# Patient Record
Sex: Female | Born: 1960 | Race: White | Hispanic: No | Smoking: Former smoker
Health system: Southern US, Community
[De-identification: ages and names within clinical notes are randomized; demographics above are authoritative.]

## PROBLEM LIST (undated history)

## (undated) DIAGNOSIS — M1990 Unspecified osteoarthritis, unspecified site: Secondary | ICD-10-CM

## (undated) DIAGNOSIS — I509 Heart failure, unspecified: Secondary | ICD-10-CM

## (undated) DIAGNOSIS — C73 Malignant neoplasm of thyroid gland: Secondary | ICD-10-CM

## (undated) DIAGNOSIS — J45909 Unspecified asthma, uncomplicated: Secondary | ICD-10-CM

## (undated) DIAGNOSIS — I10 Essential (primary) hypertension: Secondary | ICD-10-CM

## (undated) DIAGNOSIS — I428 Other cardiomyopathies: Secondary | ICD-10-CM

## (undated) DIAGNOSIS — Z87442 Personal history of urinary calculi: Secondary | ICD-10-CM

## (undated) DIAGNOSIS — R112 Nausea with vomiting, unspecified: Secondary | ICD-10-CM

## (undated) DIAGNOSIS — Z9889 Other specified postprocedural states: Secondary | ICD-10-CM

## (undated) DIAGNOSIS — F419 Anxiety disorder, unspecified: Secondary | ICD-10-CM

## (undated) DIAGNOSIS — E039 Hypothyroidism, unspecified: Secondary | ICD-10-CM

## (undated) HISTORY — DX: Essential (primary) hypertension: I10

## (undated) HISTORY — PX: REPLACEMENT TOTAL KNEE: SUR1224

## (undated) HISTORY — DX: Other cardiomyopathies: I42.8

## (undated) HISTORY — PX: CHOLECYSTECTOMY: SHX55

## (undated) HISTORY — DX: Other specified postprocedural states: Z98.890

## (undated) HISTORY — DX: Malignant neoplasm of thyroid gland: C73

## (undated) HISTORY — PX: CARPAL TUNNEL RELEASE: SHX101

---

## 1997-05-28 HISTORY — PX: THYROIDECTOMY: SHX17

## 1998-01-13 ENCOUNTER — Other Ambulatory Visit: Admission: RE | Admit: 1998-01-13 | Discharge: 1998-01-13 | Payer: Self-pay | Admitting: *Deleted

## 1998-03-31 ENCOUNTER — Encounter: Payer: Self-pay | Admitting: Surgery

## 1998-04-01 ENCOUNTER — Ambulatory Visit (HOSPITAL_COMMUNITY): Admission: RE | Admit: 1998-04-01 | Discharge: 1998-04-02 | Payer: Self-pay | Admitting: Surgery

## 2006-07-15 ENCOUNTER — Ambulatory Visit (HOSPITAL_COMMUNITY): Admission: RE | Admit: 2006-07-15 | Discharge: 2006-07-15 | Payer: Self-pay | Admitting: Family Medicine

## 2006-08-21 ENCOUNTER — Ambulatory Visit (HOSPITAL_COMMUNITY): Admission: RE | Admit: 2006-08-21 | Discharge: 2006-08-21 | Payer: Self-pay | Admitting: Family Medicine

## 2007-10-02 ENCOUNTER — Ambulatory Visit (HOSPITAL_COMMUNITY): Admission: RE | Admit: 2007-10-02 | Discharge: 2007-10-02 | Payer: Self-pay | Admitting: Family Medicine

## 2013-04-09 ENCOUNTER — Ambulatory Visit
Admission: RE | Admit: 2013-04-09 | Discharge: 2013-04-09 | Disposition: A | Discharge status: Home / Self Care | Payer: BC Managed Care – PPO | Source: Ambulatory Visit | Attending: Allergy | Admitting: Allergy

## 2013-04-09 ENCOUNTER — Other Ambulatory Visit: Payer: Self-pay | Admitting: Allergy

## 2013-04-09 DIAGNOSIS — J45909 Unspecified asthma, uncomplicated: Secondary | ICD-10-CM

## 2015-03-12 IMAGING — CR DG CHEST 2V
2 series · 2 of 2 positions shown · non-contrast
Comparison: None.

CLINICAL DATA: Cough and shortness of Breath.

EXAM:
CHEST  2 VIEW

[w chest pa]
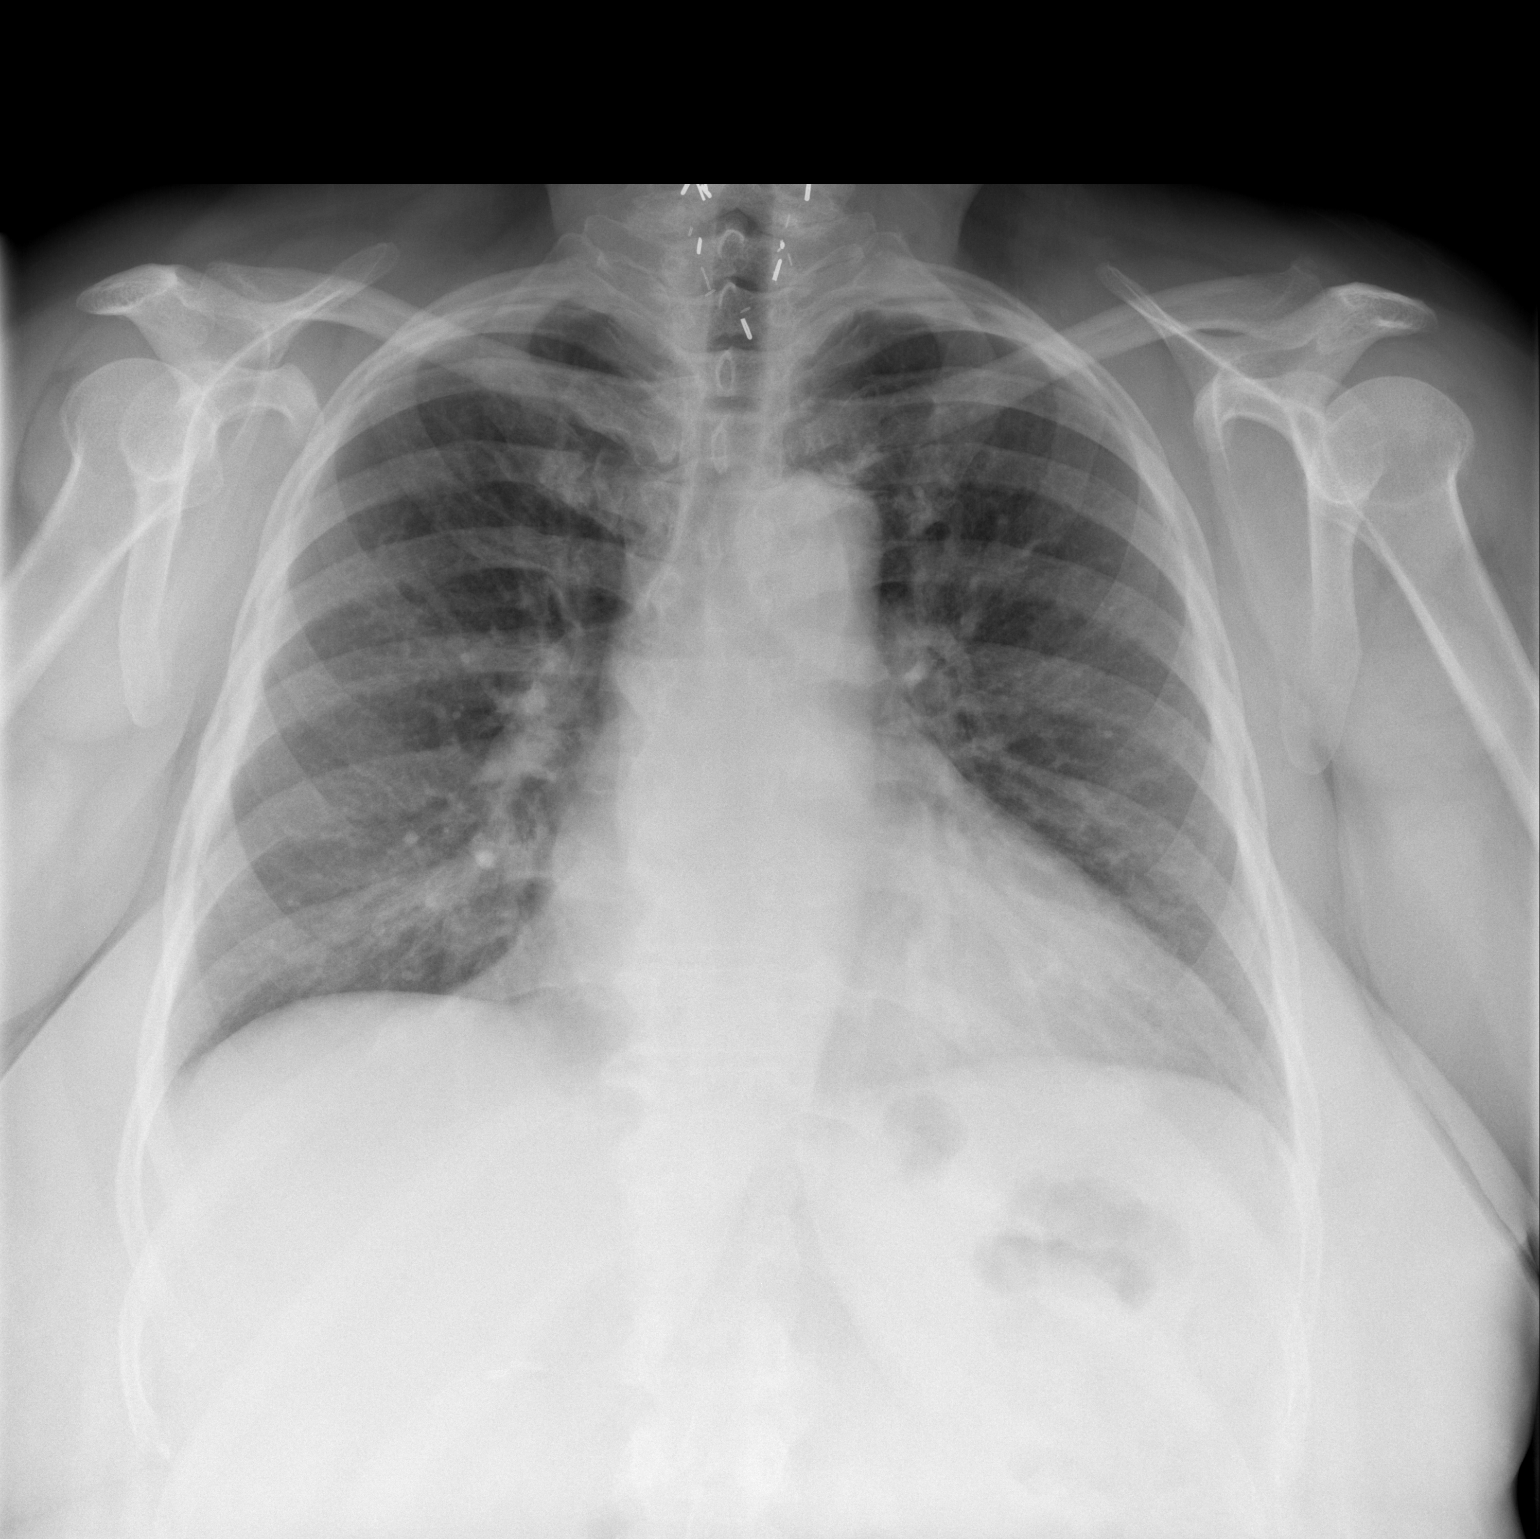

[w chest lat]
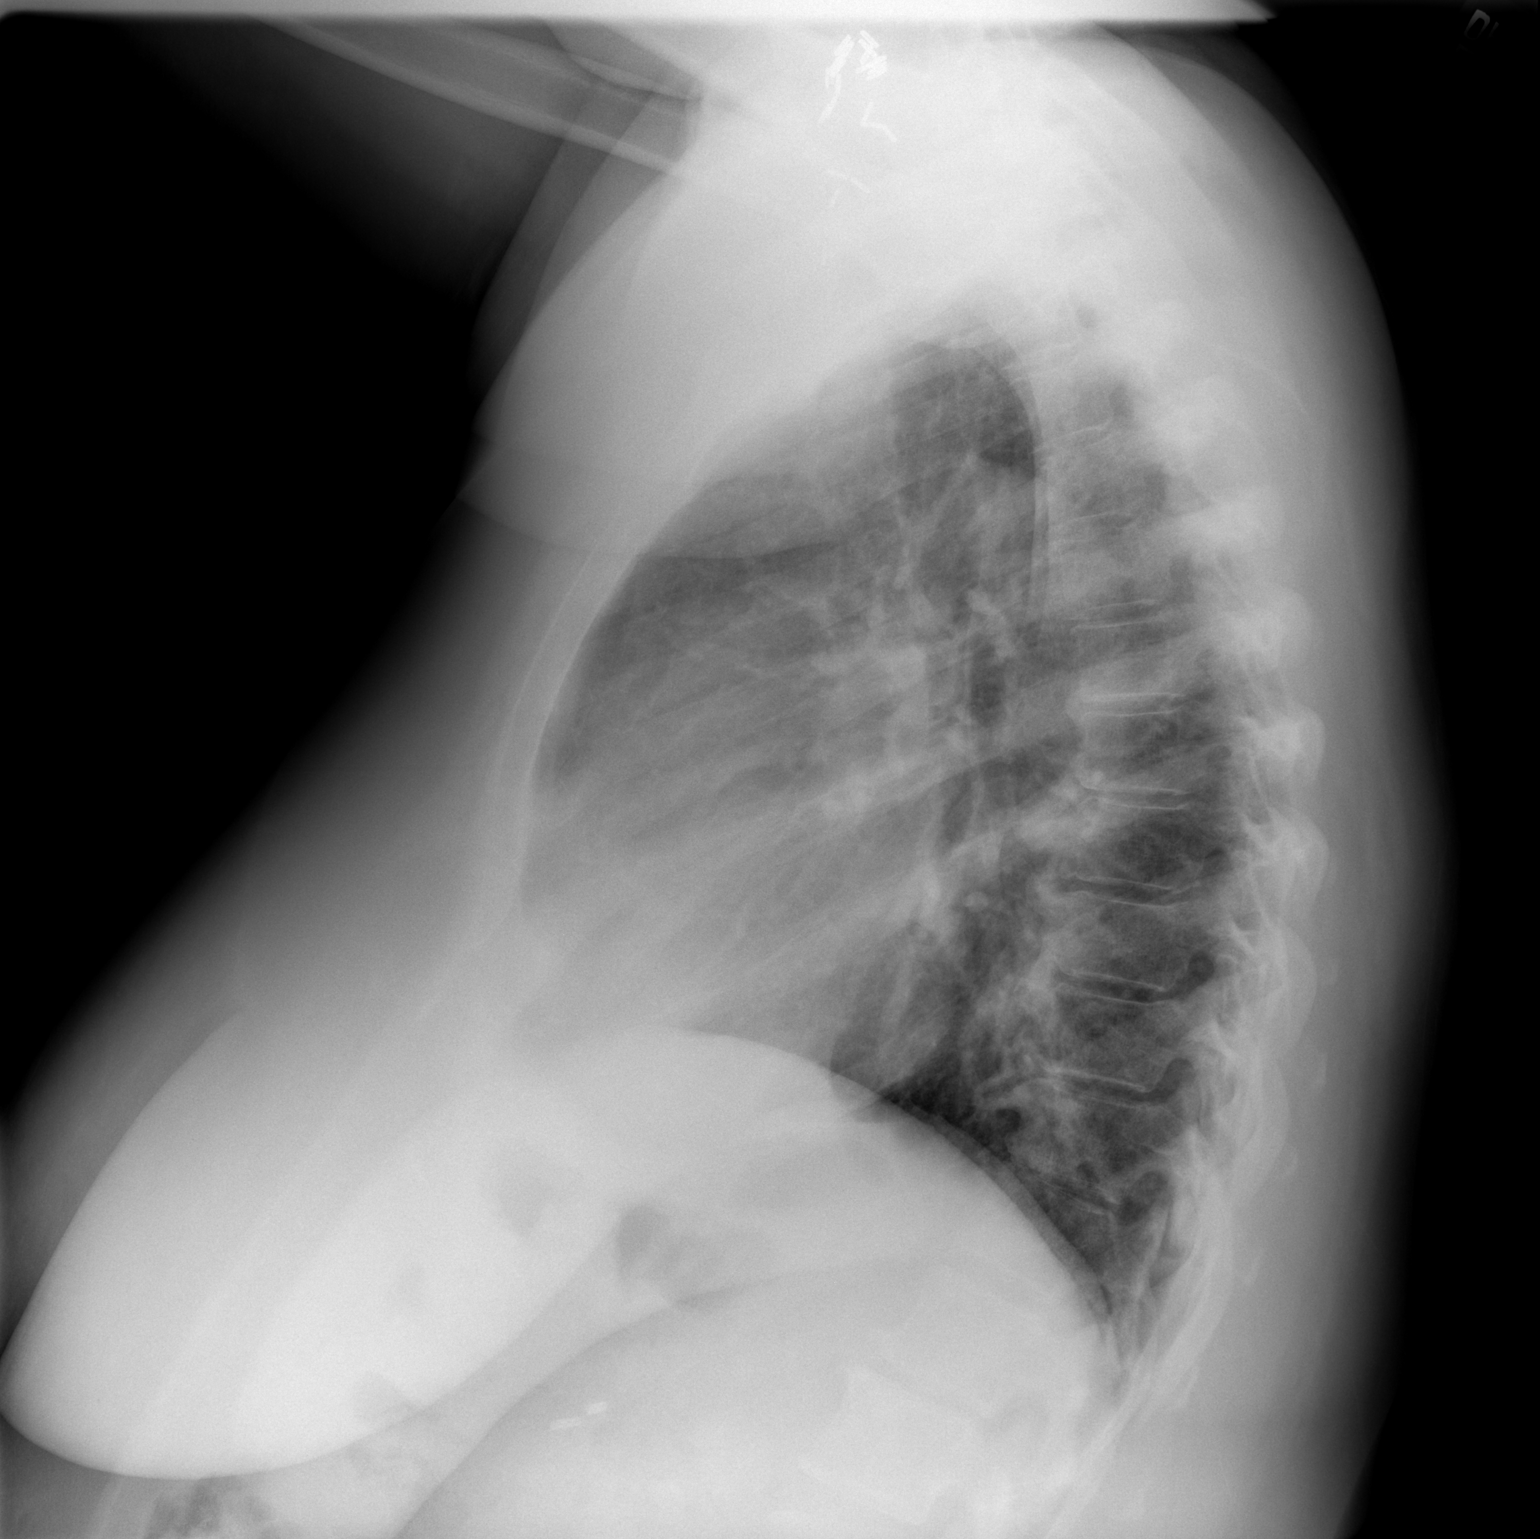

[2 of 2 positions shown; findings below may reference images not displayed]

FINDINGS: The heart is mildly enlarged. There is mild tortuosity of the
thoracic aorta. The pulmonary hila appear normal. Mild vascular
congestion but no infiltrates, edema or effusions. The bony thorax
is intact. Surgical changes are noted in the neck likely from
thyroid surgery.
IMPRESSION: Cardiac enlargement and mild vascular congestion but no overt
pulmonary edema or focal infiltrates.

## 2015-11-28 ENCOUNTER — Ambulatory Visit (INDEPENDENT_AMBULATORY_CARE_PROVIDER_SITE_OTHER): Payer: BLUE CROSS/BLUE SHIELD | Admitting: Cardiology

## 2015-11-28 ENCOUNTER — Encounter: Payer: Self-pay | Admitting: Cardiology

## 2015-11-28 VITALS — BP 194/87 | HR 76 | Ht 63.0 in | Wt 248.2 lb

## 2015-11-28 DIAGNOSIS — Z85850 Personal history of malignant neoplasm of thyroid: Secondary | ICD-10-CM

## 2015-11-28 DIAGNOSIS — I10 Essential (primary) hypertension: Secondary | ICD-10-CM

## 2015-11-28 DIAGNOSIS — I340 Nonrheumatic mitral (valve) insufficiency: Secondary | ICD-10-CM | POA: Diagnosis not present

## 2015-11-28 DIAGNOSIS — Z136 Encounter for screening for cardiovascular disorders: Secondary | ICD-10-CM

## 2015-11-28 DIAGNOSIS — I429 Cardiomyopathy, unspecified: Secondary | ICD-10-CM

## 2015-11-28 NOTE — Progress Notes (Signed)
Cardiology Office Note  Date: 11/28/2015   ID: Meredith Wong, DOB 12-08-1960, MRN [id number]  PCP: Terald Sleeper, PA  Consulting Cardiologist: Rozann Lesches, MD   Chief Complaint  Patient presents with  . Nonischemic cardiomyopathy    History of Present Illness: Meredith Wong is a 55 y.o. female referred to establish ongoing cardiology follow-up. She is a former patient of the [facility] cardiology practice, last saw Dr. Remer Macho in January of this year. I reviewed her history and updated the chart. She presents with a history of nonischemic cardiomyopathy associated with significant mitral regurgitation, findings of normal coronary arteries as of July 2015, and ultimately improvement in LVEF on medical therapy. She has a long-standing history of hypertension at baseline.  I reviewed her medications. She follows regularly with Ms. Ronnald Ramp PA-C for primary care. Blood pressure is elevated today, she states that it is not unusual for systolics to be in the [id number] to 180s.  She works at [employer]. Reports NYHA class II dyspnea with typical activities, no angina, palpitations, or syncope. I reviewed her ECG today which shows sinus rhythm with PAC, decreased R wave progression, low voltage in the limb leads.  She is not certain about her last echocardiogram, sounds like it was greater than a year ago.  Past Medical History  Diagnosis Date  . Nonischemic cardiomyopathy ([place])   . History of cardiac catheterization     Normal coronaries July 2015 [facility])  . Essential hypertension   . Thyroid cancer [facility])     Past Surgical History  Procedure Laterality Date  . Cholecystectomy    . Replacement total knee Right   . Thyroidectomy  1999    Current Outpatient Prescriptions  Medication Sig Dispense Refill  . albuterol (VENTOLIN HFA) 108 (90 Base) MCG/ACT inhaler Inhale 2 puffs into the lungs every 4 (four) hours as needed.     . ALPRAZolam (XANAX) 0.5 MG tablet Take 0.5 mg by mouth  2 (two) times daily as needed for anxiety.    . budesonide-formoterol (SYMBICORT) 160-4.5 MCG/ACT inhaler Inhale 2 puffs into the lungs 2 (two) times daily.     Marland Kitchen buPROPion (WELLBUTRIN SR) 150 MG 12 hr tablet Take 150 mg by mouth 2 (two) times daily.     . carvedilol (COREG) 25 MG tablet Take 25 mg by mouth 2 (two) times daily.     . furosemide (LASIX) 40 MG tablet Take 40 mg by mouth daily.    . Homeopathic Products (SLEEP MEDICINE PO) Take 2 tablets by mouth at bedtime. Equate Sleep Aide    . HYDROcodone-acetaminophen (NORCO/VICODIN) 5-325 MG tablet Take 1 tablet by mouth every 6 (six) hours as needed.     Marland Kitchen levothyroxine (SYNTHROID, LEVOTHROID) 125 MCG tablet Take 125 mcg by mouth daily.     Marland Kitchen loratadine (CLARITIN) 10 MG tablet Take 10 mg by mouth daily.     Marland Kitchen losartan (COZAAR) 100 MG tablet Take 100 mg by mouth daily.     . magic mouthwash SOLN Take 5 mLs by mouth as needed for mouth pain.    . meloxicam (MOBIC) 7.5 MG tablet Take 7.5 mg by mouth daily.    . potassium chloride (K-DUR) 10 MEQ tablet Take 10 mEq by mouth 2 (two) times daily.     . verapamil (CALAN-SR) 240 MG CR tablet Take 240 mg by mouth daily.     No current facility-administered medications for this visit.   Allergies:  Review of patient's allergies indicates no  known allergies.   Social History: The patient  reports that she has quit smoking. Her smoking use included Cigarettes. She does not have any smokeless tobacco history on file. She reports that she does not drink alcohol or use illicit drugs.   Family History: The patient's family history includes Heart disease in her father; Thyroid cancer in her father and mother.   ROS:  Please see the history of present illness. Otherwise, complete review of systems is positive for back pain and neuropathic symptoms.  All other systems are reviewed and negative.   Physical Exam: VS:  BP 194/87 mmHg  Pulse 76  Ht 5\' 3"  (1.6 m)  Wt 248 lb 3.2 oz (112.583 kg)  BMI 43.98  kg/m2  SpO2 99%, BMI Body mass index is 43.98 kg/(m^2).  Wt Readings from Last 3 Encounters:  11/28/15 248 lb 3.2 oz (112.583 kg)    General: Overweight woman, appears comfortable at rest. HEENT: Conjunctiva and lids normal, oropharynx clear. Neck: Supple, no elevated JVP or carotid bruits, no thyromegaly. Lungs: Clear to auscultation, nonlabored breathing at rest. Cardiac: Regular rate and rhythm, no S3, soft systolic murmur, no pericardial rub. Abdomen: Soft, nontender, bowel sounds present. Extremities: No pitting edema, distal pulses 2+. Skin: Warm and dry. Musculoskeletal: No kyphosis. Neuropsychiatric: Alert and oriented x3, affect grossly appropriate.  ECG: No prior tracing available today for comparison.  Recent Labwork:  July 2015: Hemoglobin 13.3, platelets 221, potassium 3.6, BUN 12, creatinine 0.7  Other Studies Reviewed Today:  Cardiac catheterization 12/11/2013 [facility]): Pressures:  AO [id number] / 78 LV systolic [id number] LV End-diastolic 24. RA mean : 17 mmHg.  RV :21/17 mmHg.  PA : 50/21(37) mmHg. PCW mean:27 mmHg. CO : 5.7 L/min. CI: 2.6 L/min/m2.  Oximetry: Mixed Venous: 65% Ao: 91%  Coronary anatomy: Left main: Normal Left anterior descending: Wraps around the apex, first and second diagonals are moderate in size, angiographically normal Left circumflex: Angiographically normal Right coronary artery: Angiographically normal Right dominant  Left ventriculogram: Not done.  Complications: None  TEE 12/11/2013 [facility]): Interpretation Summary A transesophageal echocardiogram with Doppler and color flow Doppler was performed. A three-dimenisonal acquisition was performed. Saline contrast injection was performed. There is mild tricuspid regurgitation (1+). There is moderate to moderate-severe mitral regurgitation (2-3+). The left ventricle is mildly dilated. There is normal left ventricular wall thickness. There is severe diffuse hypokinesis of the left  ventricle. The left ventricular ejection fraction is markedly reduced (25-30%). The left atrium is mildly dilated. Injection of contrast documented no interatrial shunt. There is mild atherosclerosis of the thoracic aorta. The right ventricular ejection fraction is grossly normal.  Left Ventricle The left ventricle is mildly dilated. There is normal left ventricular wall thickness.There is severe diffuse hypokinesis of the left ventricle. The left ventricular ejection fraction is markedly reduced (25-30%).  Right Ventricle The right ventricle is normal size. The right ventricular ejection fraction is grossly normal.  Atria The left atrium is mildly dilated. Injection of contrast documented no interatrial shunt. The right atrium is normal.  Mitral Valve The mitral valve leaflets appear normal. There is no evidence of stenosis, fluttering, or prolapse. There is moderate to moderate-severe mitral regurgitation (2-3+).  Tricuspid Valve The tricuspid valve leaflets are thin and pliable and the valve motion is normal. There is mild tricuspid regurgitation (1+).  Aortic Valve The aortic valve is tri-leaflet with thin, pliable leaflets that move normally. No aortic regurgitation is present.  Pulmonic Valve The pulmonic valve is not well seen, but is  grossly normal. There is trace pulmonic valvular regurgitation.  Vessels The aortic root is normal. There is mild atherosclerosis of. the thoracic aorta. The pulmonary artery is not well visualized.  Pericardium There is no pericardial effusion.  Assessment and Plan:  1. Nonischemic cardiomyopathy by history. At this point plan to continue current regimen and follow-up with an echocardiogram to reassess cardiac structure and function. Verapamil SR not optimal if she still has LV dysfunction, however otherwise may not be an issue. May need to consider switching her to Norvasc and even adding Aldactone to get better blood pressure  control.  2. History of normal coronary arteries at cardiac catheterization in July 2015.  3. Mitral regurgitation, moderate to severe range at original diagnosis of cardiomyopathy and subsequently improved. This will be reassessed with repeat echocardiogram.  4. History of thyroid cancer status post thyroidectomy with family history of same.  Current medicines were reviewed with the patient today.   Orders Placed This Encounter  Procedures  . EKG 12-Lead  . ECHO COMPLETE    Disposition: Follow-up with me in 6 months.  Signed, Satira Sark, MD, [facility] 11/28/2015 2:26 PM    [facility] [place], [place], [place] [address] Phone: [phone]; Fax: [phone]

## 2015-11-28 NOTE — Patient Instructions (Signed)
Your physician wants you to follow-up in: 6 months with DR. MCDOWELL You will receive a reminder letter in the mail two months in advance. If you don't receive a letter, please call our office to schedule the follow-up appointment.  Your physician recommends that you continue on your current medications as directed. Please refer to the Current Medication list given to you today.  Your physician has requested that you have an echocardiogram. Echocardiography is a painless test that uses sound waves to create images of your heart. It provides your doctor with information about the size and shape of your heart and how well your heart's chambers and valves are working. This procedure takes approximately one hour. There are no restrictions for this procedure.  Thank you for choosing [facility]!!

## 2015-12-02 ENCOUNTER — Other Ambulatory Visit: Payer: Self-pay | Admitting: Orthopaedic Surgery

## 2015-12-02 DIAGNOSIS — M545 Low back pain: Secondary | ICD-10-CM

## 2015-12-13 ENCOUNTER — Other Ambulatory Visit: Payer: BLUE CROSS/BLUE SHIELD

## 2016-04-26 ENCOUNTER — Encounter: Payer: Self-pay | Admitting: Internal Medicine

## 2016-05-11 ENCOUNTER — Ambulatory Visit: Payer: BLUE CROSS/BLUE SHIELD | Admitting: Gastroenterology

## 2016-05-24 ENCOUNTER — Other Ambulatory Visit: Payer: BLUE CROSS/BLUE SHIELD

## 2016-06-11 ENCOUNTER — Other Ambulatory Visit: Payer: Self-pay | Admitting: Gastroenterology

## 2016-06-11 ENCOUNTER — Ambulatory Visit: Payer: BLUE CROSS/BLUE SHIELD | Admitting: Gastroenterology

## 2016-06-11 ENCOUNTER — Other Ambulatory Visit: Payer: Self-pay

## 2016-06-11 ENCOUNTER — Encounter: Payer: Self-pay | Admitting: Gastroenterology

## 2016-06-11 ENCOUNTER — Ambulatory Visit (INDEPENDENT_AMBULATORY_CARE_PROVIDER_SITE_OTHER): Payer: BLUE CROSS/BLUE SHIELD | Admitting: Gastroenterology

## 2016-06-11 ENCOUNTER — Encounter: Payer: Self-pay | Admitting: *Deleted

## 2016-06-11 VITALS — BP 162/89 | HR 75 | Temp 98.5°F | Ht 63.0 in | Wt 226.0 lb

## 2016-06-11 DIAGNOSIS — K529 Noninfective gastroenteritis and colitis, unspecified: Secondary | ICD-10-CM | POA: Diagnosis not present

## 2016-06-11 DIAGNOSIS — Z800 Family history of malignant neoplasm of digestive organs: Secondary | ICD-10-CM | POA: Diagnosis not present

## 2016-06-11 DIAGNOSIS — R197 Diarrhea, unspecified: Secondary | ICD-10-CM

## 2016-06-11 MED ORDER — PEG 3350-KCL-NA BICARB-NACL 420 G PO SOLR: 4000.0000 mL | ORAL | 0 refills | Status: DC

## 2016-06-11 MED ORDER — DICYCLOMINE HCL 10 MG PO CAPS: 10.0000 mg | ORAL_CAPSULE | Freq: Three times a day (TID) | ORAL | 3 refills | Status: AC

## 2016-06-11 NOTE — Assessment & Plan Note (Signed)
56 year old female with 6 month history of diarrhea, associated abdominal cramping, unintentional weight loss. No rectal bleeding. Sister with history of Crohn's disease and father diagnosed with colon cancer in his early 16s, succumbing to the disease. No prior colonoscopy. Doubt infectious process as it has persisted for months, but I've ordered stool studies to have on file. Stop levsin and trial of Bentyl. Proceed with colonoscopy in near future.   Proceed with TCS with Dr. Gala Romney in near future: the risks, benefits, and alternatives have been discussed with the patient in detail. The patient states understanding and desires to proceed. PROPOFOL due to polypharmacy Bentyl sent to pharmacy  Stool studies ordered today

## 2016-06-11 NOTE — Assessment & Plan Note (Signed)
Father diagnosed age 58, succumbed to disease.

## 2016-06-11 NOTE — Patient Instructions (Addendum)
Please complete the stool studies.   Stop hyoscyamine. Start Bentyl 1 capsule 30 minutes before each meal and at bedtime. No more than 4 a day.   We have scheduled you for a colonoscopy with Dr. Gala Romney in the near future!

## 2016-06-11 NOTE — Progress Notes (Signed)
Primary Care Physician:  Octavio Graves, DO Primary Gastroenterologist:  Dr. Gala Romney   Chief Complaint  Patient presents with  . Diarrhea    yellow, green, brown. After eating/drinking  . Constipation  . Abdominal Pain    LLQ    HPI:   Meredith Wong is a 56 y.o. female presenting today at the request of Dr. Melina Copa secondary to abdominal pain and diarrhea.   Notes postprandial urgency, pure water, yellow/brown/black. Feels like she is going to pass out. Associated LLQ discomfort. Sometimes in the bathroom for 20-30 minutes. States while she is sitting on the toilet she has "constipation", all during the same episode. Rare to have a solid, soft stool. Loose stools daily. Has taken Imodium without relief. Symptoms present for about 6 months. Very minimal improvement with hyoscyamine. No hematochezia. No recent or remote history of antibiotics. Was 89 about a year ago, now 31. Unintentional weight loss. Associated nausea but no vomiting. Mother passed away New Years Day.   No prior colonoscopy. No prior EGD. Sister with history of Crohn's disease, Father diagnosed with colon cancer at 81 and succumbed to the disease.   Past Medical History:  Diagnosis Date  . Essential hypertension   . History of cardiac catheterization    Normal coronaries July 2015 [facility])  . Nonischemic cardiomyopathy ([place])   . Thyroid cancer [facility])     Past Surgical History:  Procedure Laterality Date  . CHOLECYSTECTOMY     1990s  . REPLACEMENT TOTAL KNEE Right   . THYROIDECTOMY  1999    Current Outpatient Prescriptions  Medication Sig Dispense Refill  . albuterol (VENTOLIN HFA) 108 (90 Base) MCG/ACT inhaler Inhale 2 puffs into the lungs every 4 (four) hours as needed.     . ALPRAZolam (XANAX) 0.5 MG tablet Take 0.5 mg by mouth 2 (two) times daily as needed for anxiety.    . budesonide-formoterol (SYMBICORT) 160-4.5 MCG/ACT inhaler Inhale 2 puffs into the lungs 2 (two) times daily.     Marland Kitchen buPROPion  (WELLBUTRIN SR) 150 MG 12 hr tablet Take 150 mg by mouth 2 (two) times daily.     . carvedilol (COREG) 25 MG tablet Take 25 mg by mouth 2 (two) times daily.     . furosemide (LASIX) 40 MG tablet Take 40 mg by mouth daily.    . Homeopathic Products (SLEEP MEDICINE PO) Take 2 tablets by mouth at bedtime. Equate Sleep Aide    . HYDROcodone-acetaminophen (NORCO/VICODIN) 5-325 MG tablet Take 1 tablet by mouth every 6 (six) hours as needed.     . hyoscyamine (LEVSIN, ANASPAZ) 0.125 MG tablet Take 0.125 mg by mouth every 4 (four) hours as needed.    Marland Kitchen levothyroxine (SYNTHROID, LEVOTHROID) 125 MCG tablet Take 125 mcg by mouth daily.     Marland Kitchen loratadine (CLARITIN) 10 MG tablet Take 10 mg by mouth daily.     Marland Kitchen losartan (COZAAR) 100 MG tablet Take 100 mg by mouth daily.    . meloxicam (MOBIC) 7.5 MG tablet Take 7.5 mg by mouth daily.    . potassium chloride (K-DUR) 10 MEQ tablet Take 10 mEq by mouth 2 (two) times daily.     . verapamil (CALAN-SR) 240 MG CR tablet Take 240 mg by mouth daily.    Marland Kitchen dicyclomine (BENTYL) 10 MG capsule Take 1 capsule (10 mg total) by mouth 4 (four) times daily -  before meals and at bedtime. 120 capsule 3  . losartan (COZAAR) 100 MG tablet Take 100 mg  by mouth daily.      No current facility-administered medications for this visit.     Allergies as of 06/11/2016 - Review Complete 06/11/2016  Allergen Reaction Noted  . Latex Hives 11/19/2013    Family History  Problem Relation Age of Onset  . Thyroid cancer Father   . Heart disease Father   . Colon cancer Father 39    metastatic   . Thyroid cancer Mother   . Thyroid cancer Sister   . Crohn's disease Sister     Social History   Social History  . Marital status: Divorced    Spouse name: N/A  . Number of children: N/A  . Years of education: N/A   Occupational History  . [employer] in [place] History Main Topics  . Smoking status: Former Smoker    Types: Cigarettes  . Smokeless tobacco: Former Systems developer      Comment: smoked as a teenager   . Alcohol use No  . Drug use: No  . Sexual activity: Not on file   Other Topics Concern  . Not on file   Social History Narrative  . No narrative on file    Review of Systems: Gen: Denies any fever, chills, fatigue, weight loss, lack of appetite.  CV: Denies chest pain, heart palpitations, peripheral edema, syncope.  Resp: Denies shortness of breath at rest or with exertion. Denies wheezing or cough.  GI: see HPI  GU : Denies urinary burning, urinary frequency, urinary hesitancy MS: Denies joint pain, muscle weakness, cramps, or limitation of movement.  Derm: Denies rash, itching, dry skin Psych: Denies depression, anxiety, memory loss, and confusion Heme: Denies bruising, bleeding, and enlarged lymph nodes.  Physical Exam: BP (!) 162/89   Pulse 75   Temp 98.5 F (36.9 C) (Oral)   Ht _0  (1.6 m)   Wt 226 lb (102.5 kg)   BMI 40.03 kg/m  General:   Alert and oriented. Pleasant and cooperative. Well-nourished and well-developed.  Head:  Normocephalic and atraumatic. Eyes:  Without icterus, sclera clear and conjunctiva pink.  Ears:  Normal auditory acuity. Nose:  No deformity, discharge,  or lesions. Mouth:  No deformity or lesions, oral mucosa pink.  Lungs:  Clear to auscultation bilaterally. No wheezes, rales, or rhonchi. No distress.  Heart:  S1, S2 present without murmurs appreciated.  Abdomen:  +BS, soft, non-tender and non-distended. No HSM noted. No guarding or rebound. No masses appreciated.  Rectal:  Deferred  Msk:  Symmetrical without gross deformities. Normal posture. Extremities:  Without  edema. Neurologic:  Alert and  oriented x4;  grossly normal neurologically. Psych:  Alert and cooperative. Normal mood and affect.  Outside labs Nov 2017: Hgb 12.4, Hct 38.7, Albumin 4.3, Alk Phos 60, AST 26, ALT 48, TSH 1.6

## 2016-06-12 LAB — C. DIFFICILE GDH AND TOXIN A/B
C. DIFF TOXIN A/B: NOT DETECTED
C. DIFFICILE GDH: NOT DETECTED

## 2016-06-12 NOTE — Progress Notes (Signed)
Cdiff negative. Awaiting stool culture and Giardia (which likely will be negative). Colonoscopy as planned.

## 2016-06-12 NOTE — Progress Notes (Signed)
cc'ed to pcp °

## 2016-06-14 LAB — GIARDIA ANTIGEN

## 2016-06-15 LAB — STOOL CULTURE

## 2016-06-20 NOTE — Patient Instructions (Signed)
[facility]  06/20/2016     @PREFPERIOPPHARMACY @   Your procedure is scheduled on 07/02/2016.  Report to [facility] at 8:30 A.M.  Call this number if you have problems the morning of surgery:  [phone]   Remember:  Do not eat food or drink liquids according to instructions given to you by Dr Roseanne Kaufman office  Take these medicines the morning of surgery with A SIP OF WATER Albuterol inhaler and bring with you, Symbicort inhaler, Xanax, Claritin, Coreg,  Wellbutrin, Bentyl, Verapamil, Hydrocodone, Levsin, Synthroid, Cozaar, Mobic   Do not wear jewelry, make-up or nail polish.  Do not wear lotions, powders, or perfumes, or deoderant.  Do not shave 48 hours prior to surgery.  Men may shave face and neck.  Do not bring valuables to the hospital.  [facility] is not responsible for any belongings or valuables.  Contacts, dentures or bridgework may not be worn into surgery.  Leave your suitcase in the car.  After surgery it may be brought to your room.  For patients admitted to the hospital, discharge time will be determined by your treatment team.  Patients discharged the day of surgery will not be allowed to drive home.    Please read over the following fact sheets that you were given. Anesthesia Post-op Instructions     PATIENT INSTRUCTIONS POST-ANESTHESIA  IMMEDIATELY FOLLOWING SURGERY:  Do not drive or operate machinery for the first twenty four hours after surgery.  Do not make any important decisions for twenty four hours after surgery or while taking narcotic pain medications or sedatives.  If you develop intractable nausea and vomiting or a severe headache please notify your doctor immediately.  FOLLOW-UP:  Please make an appointment with your surgeon as instructed. You do not need to follow up with anesthesia unless specifically instructed to do so.  WOUND CARE INSTRUCTIONS (if applicable):  Keep a dry clean dressing on the anesthesia/puncture wound site if there  is drainage.  Once the wound has quit draining you may leave it open to air.  Generally you should leave the bandage intact for twenty four hours unless there is drainage.  If the epidural site drains for more than 36-48 hours please call the anesthesia department.  QUESTIONS?:  Please feel free to call your physician or the hospital operator if you have any questions, and they will be happy to assist you.      Colonoscopy, Adult A colonoscopy is an exam to look at the entire large intestine. During the exam, a lubricated, bendable tube is inserted into the anus and then passed into the rectum, colon, and other parts of the large intestine. A colonoscopy is often done as a part of normal colorectal screening or in response to certain symptoms, such as anemia, persistent diarrhea, abdominal pain, and blood in the stool. The exam can help screen for and diagnose medical problems, including:  Tumors.  Polyps.  Inflammation.  Areas of bleeding. Tell a health care provider about:  Any allergies you have.  All medicines you are taking, including vitamins, herbs, eye drops, creams, and over-the-counter medicines.  Any problems you or family members have had with anesthetic medicines.  Any blood disorders you have.  Any surgeries you have had.  Any medical conditions you have.  Any problems you have had passing stool. What are the risks? Generally, this is a safe procedure. However, problems may occur, including:  Bleeding.  A tear in the intestine.  A reaction to medicines  given during the exam.  Infection (rare). What happens before the procedure? Eating and drinking restrictions  Follow instructions from your health care provider about eating and drinking, which may include:  A few days before the procedure - follow a low-fiber diet. Avoid nuts, seeds, dried fruit, raw fruits, and vegetables.  1-3 days before the procedure - follow a clear liquid diet. Drink only clear  liquids, such as clear broth or bouillon, black coffee or tea, clear juice, clear soft drinks or sports drinks, gelatin desert, and popsicles. Avoid any liquids that contain red or purple dye.  On the day of the procedure - do not eat or drink anything during the 2 hours before the procedure, or within the time period that your health care provider recommends. Bowel prep  If you were prescribed an oral bowel prep to clean out your colon:  Take it as told by your health care provider. Starting the day before your procedure, you will need to drink a large amount of medicated liquid. The liquid will cause you to have multiple loose stools until your stool is almost clear or light green.  If your skin or anus gets irritated from diarrhea, you may use these to relieve the irritation:  Medicated wipes, such as adult wet wipes with aloe and vitamin E.  A skin soothing-product like petroleum jelly.  If you vomit while drinking the bowel prep, take a break for up to 60 minutes and then begin the bowel prep again. If vomiting continues and you cannot take the bowel prep without vomiting, call your health care provider. General instructions  Ask your health care provider about changing or stopping your regular medicines. This is especially important if you are taking diabetes medicines or blood thinners.  Plan to have someone take you home from the hospital or clinic. What happens during the procedure?  An IV tube may be inserted into one of your veins.  You will be given medicine to help you relax (sedative).  To reduce your risk of infection:  Your health care team will wash or sanitize their hands.  Your anal area will be washed with soap.  You will be asked to lie on your side with your knees bent.  Your health care provider will lubricate a long, thin, flexible tube. The tube will have a camera and a light on the end.  The tube will be inserted into your anus.  The tube will be gently  eased through your rectum and colon.  Air will be delivered into your colon to keep it open. You may feel some pressure or cramping.  The camera will be used to take images during the procedure.  A small tissue sample may be removed from your body to be examined under a microscope (biopsy). If any potential problems are found, the tissue will be sent to a lab for testing.  If small polyps are found, your health care provider may remove them and have them checked for cancer cells.  The tube that was inserted into your anus will be slowly removed. The procedure may vary among health care providers and hospitals. What happens after the procedure?  Your blood pressure, heart rate, breathing rate, and blood oxygen level will be monitored until the medicines you were given have worn off.  Do not drive for 24 hours after the exam.  You may have a small amount of blood in your stool.  You may pass gas and have mild abdominal cramping or bloating  due to the air that was used to inflate your colon during the exam.  It is up to you to get the results of your procedure. Ask your health care provider, or the department performing the procedure, when your results will be ready. This information is not intended to replace advice given to you by your health care provider. Make sure you discuss any questions you have with your health care provider. Document Released: 05/11/2000 Document Revised: 12/02/2015 Document Reviewed: 07/26/2015 Elsevier Interactive Patient Education  2017 [employer].

## 2016-06-25 ENCOUNTER — Encounter (HOSPITAL_COMMUNITY)
Admission: RE | Admit: 2016-06-25 | Discharge: 2016-06-25 | Disposition: A | Discharge status: Home / Self Care | Payer: BLUE CROSS/BLUE SHIELD | Source: Ambulatory Visit | Attending: Internal Medicine | Admitting: Internal Medicine

## 2016-06-25 ENCOUNTER — Encounter (HOSPITAL_COMMUNITY): Payer: Self-pay

## 2016-06-25 DIAGNOSIS — Z01812 Encounter for preprocedural laboratory examination: Secondary | ICD-10-CM | POA: Diagnosis not present

## 2016-06-25 DIAGNOSIS — R197 Diarrhea, unspecified: Secondary | ICD-10-CM | POA: Diagnosis not present

## 2016-06-25 HISTORY — DX: Other specified postprocedural states: Z98.890

## 2016-06-25 HISTORY — DX: Unspecified asthma, uncomplicated: J45.909

## 2016-06-25 HISTORY — DX: Unspecified osteoarthritis, unspecified site: M19.90

## 2016-06-25 HISTORY — DX: Nausea with vomiting, unspecified: R11.2

## 2016-06-25 HISTORY — DX: Anxiety disorder, unspecified: F41.9

## 2016-06-25 HISTORY — DX: Personal history of urinary calculi: Z87.442

## 2016-06-25 HISTORY — DX: Heart failure, unspecified: I50.9

## 2016-06-25 HISTORY — DX: Hypothyroidism, unspecified: E03.9

## 2016-06-25 LAB — BASIC METABOLIC PANEL
ANION GAP: 6 (ref 5–15)
BUN: 12 mg/dL (ref 6–20)
CO2: 27 mmol/L (ref 22–32)
Calcium: 8.8 mg/dL — ABNORMAL LOW (ref 8.9–10.3)
Chloride: 108 mmol/L (ref 101–111)
Creatinine, Ser: 0.77 mg/dL (ref 0.44–1.00)
GFR calc Af Amer: >60 mL/min (ref >60)
GFR calc non Af Amer: >60 mL/min (ref >60)
GLUCOSE: 144 mg/dL — AB (ref 65–99)
POTASSIUM: 4.1 mmol/L (ref 3.5–5.1)
Sodium: 141 mmol/L (ref 135–145)

## 2016-06-25 LAB — CBC
HEMATOCRIT: 39.4 % (ref 36.0–46.0)
Hemoglobin: 12.8 g/dL (ref 12.0–15.0)
MCH: 30.9 pg (ref 26.0–34.0)
MCHC: 32.5 g/dL (ref 30.0–36.0)
MCV: 95.2 fL (ref 78.0–100.0)
PLATELETS: 232 10*3/uL (ref 150–400)
RBC: 4.14 MIL/uL (ref 3.87–5.11)
RDW: 12.9 % (ref 11.5–15.5)
WBC: 4.7 10*3/uL (ref 4.0–10.5)

## 2016-06-26 ENCOUNTER — Other Ambulatory Visit (HOSPITAL_COMMUNITY): Payer: BLUE CROSS/BLUE SHIELD

## 2016-07-02 ENCOUNTER — Encounter (HOSPITAL_COMMUNITY): Payer: Self-pay | Admitting: *Deleted

## 2016-07-02 ENCOUNTER — Encounter (HOSPITAL_COMMUNITY)
Admission: RE | Disposition: A | Discharge status: Home / Self Care | Payer: Self-pay | Source: Ambulatory Visit | Attending: Internal Medicine

## 2016-07-02 ENCOUNTER — Ambulatory Visit (HOSPITAL_COMMUNITY): Payer: BLUE CROSS/BLUE SHIELD | Admitting: Anesthesiology

## 2016-07-02 ENCOUNTER — Ambulatory Visit (HOSPITAL_COMMUNITY)
Admission: RE | Admit: 2016-07-02 | Discharge: 2016-07-02 | Disposition: A | Discharge status: Home / Self Care | Payer: BLUE CROSS/BLUE SHIELD | Source: Ambulatory Visit | Attending: Internal Medicine | Admitting: Internal Medicine

## 2016-07-02 DIAGNOSIS — K6389 Other specified diseases of intestine: Secondary | ICD-10-CM | POA: Insufficient documentation

## 2016-07-02 DIAGNOSIS — K529 Noninfective gastroenteritis and colitis, unspecified: Secondary | ICD-10-CM | POA: Insufficient documentation

## 2016-07-02 DIAGNOSIS — K5730 Diverticulosis of large intestine without perforation or abscess without bleeding: Secondary | ICD-10-CM | POA: Diagnosis not present

## 2016-07-02 DIAGNOSIS — K635 Polyp of colon: Secondary | ICD-10-CM

## 2016-07-02 HISTORY — PX: POLYPECTOMY: SHX5525

## 2016-07-02 HISTORY — PX: COLONOSCOPY WITH PROPOFOL: SHX5780

## 2016-07-02 HISTORY — PX: BIOPSY: SHX5522

## 2016-07-02 SURGERY — COLONOSCOPY WITH PROPOFOL
Anesthesia: Monitor Anesthesia Care

## 2016-07-02 MED ORDER — GLYCOPYRROLATE 0.2 MG/ML IJ SOLN
0.2000 mg | Freq: Once | INTRAMUSCULAR | Status: AC
  Administered 2016-07-02: 0.2 mg via INTRAVENOUS

## 2016-07-02 MED ORDER — MIDAZOLAM HCL 2 MG/2ML IJ SOLN
INTRAMUSCULAR | Status: AC
  Filled 2016-07-02: qty 2

## 2016-07-02 MED ORDER — FENTANYL CITRATE (PF) 100 MCG/2ML IJ SOLN
INTRAMUSCULAR | Status: AC
  Filled 2016-07-02: qty 2

## 2016-07-02 MED ORDER — GLYCOPYRROLATE 0.2 MG/ML IJ SOLN
INTRAMUSCULAR | Status: AC
  Filled 2016-07-02: qty 1

## 2016-07-02 MED ORDER — ONDANSETRON HCL 4 MG/2ML IJ SOLN
INTRAMUSCULAR | Status: AC
  Filled 2016-07-02: qty 2

## 2016-07-02 MED ORDER — ONDANSETRON HCL 4 MG/2ML IJ SOLN
4.0000 mg | Freq: Once | INTRAMUSCULAR | Status: AC
  Administered 2016-07-02: 4 mg via INTRAVENOUS

## 2016-07-02 MED ORDER — MIDAZOLAM HCL 2 MG/2ML IJ SOLN
1.0000 mg | INTRAMUSCULAR | Status: DC | PRN
  Administered 2016-07-02: 2 mg via INTRAVENOUS

## 2016-07-02 MED ORDER — FENTANYL CITRATE (PF) 100 MCG/2ML IJ SOLN
25.0000 ug | INTRAMUSCULAR | Status: AC
  Administered 2016-07-02: 25 ug via INTRAVENOUS

## 2016-07-02 MED ORDER — PROPOFOL 10 MG/ML IV BOLUS
INTRAVENOUS | Status: AC
Start: 2016-07-02 — End: 2016-07-02
  Filled 2016-07-02: qty 20

## 2016-07-02 MED ORDER — PROPOFOL 500 MG/50ML IV EMUL
INTRAVENOUS | Status: DC | PRN
  Administered 2016-07-02: 200 ug/kg/min via INTRAVENOUS

## 2016-07-02 MED ORDER — PROPOFOL 10 MG/ML IV BOLUS
INTRAVENOUS | Status: AC
  Filled 2016-07-02: qty 20

## 2016-07-02 MED ORDER — LACTATED RINGERS IV SOLN
INTRAVENOUS | Status: DC
  Administered 2016-07-02: 10:00:00 via INTRAVENOUS

## 2016-07-02 NOTE — Op Note (Signed)
[facility] Patient Name: Meredith Wong Procedure Date: 07/02/2016 9:21 AM MRN: [id number] Date of Birth: July 20, 1960 Attending MD: Norvel Richards , MD CSN: [id number] Age: 56 Admit Type: Outpatient Procedure:                Ileo-colonoscopy with snare polypectomy and biopsy Indications:              Chronic diarrhea Providers:                Norvel Richards, MD, Jeanann Lewandowsky. Sharon Seller, RN,                            Aram Candela Referring MD:              Medicines:                Propofol per Anesthesia Complications:            No immediate complications. Estimated Blood Loss:     Estimated blood loss was minimal. Procedure:                Pre-Anesthesia Assessment:                           - Prior to the procedure, a History and Physical                            was performed, and patient medications and                            allergies were reviewed. The patient's tolerance of                            previous anesthesia was also reviewed. The risks                            and benefits of the procedure and the sedation                            options and risks were discussed with the patient.                            All questions were answered, and informed consent                            was obtained. Prior Anticoagulants: The patient has                            taken no previous anticoagulant or antiplatelet                            agents. ASA Grade Assessment: II - A patient with                            mild systemic disease. After reviewing the risks  and benefits, the patient was deemed in                            satisfactory condition to undergo the procedure.                           After obtaining informed consent, the colonoscope                            was passed under direct vision. Throughout the                            procedure, the patient's blood pressure, pulse, and       oxygen saturations were monitored continuously. The                            [id number] scope was introduced through                            the and advanced to the 5 cm into the ileum. The                            terminal ileum, ileocecal valve, appendiceal                            orifice, and rectum were photographed. Anatomical                            landmarks were photographed. The entire colon was                            well visualized. The entire colon was well                            visualized. The colonoscopy was performed without                            difficulty. The patient tolerated the procedure                            well. The quality of the bowel preparation was                            adequate. Scope In: 9:49:13 AM Scope Out: 10:04:25 AM Scope Withdrawal Time: 0 hours 10 minutes 35 seconds  Total Procedure Duration: 0 hours 15 minutes 12 seconds  Findings:      The perianal and digital rectal examinations were normal.      Scattered small and large-mouthed diverticula were found in the sigmoid       colon and descending colon.      A 4 mm polyp was found in the sigmoid colon. The polyp was pedunculated.       The polyp was removed with a cold snare. Resection and retrieval were       complete. Estimated blood loss was  minimal. Segmental biopsies of the       right and lerft colon were taken with a cold forceps for histology.       Estimated blood loss was minimal.      The exam was otherwise without abnormality on direct and retroflexion       views. Impression:               - Diverticulosis in the sigmoid colon and in the                            descending colon.                           - One 4 mm polyp in the sigmoid colon, removed with                            a cold snare. Resected and retrieved. Status post                            segmental biopsy.                           - The examination was otherwise  normal on direct                            and retroflexion views. Moderate Sedation:      Moderate (conscious) sedation was personally administered by an       anesthesia professional. The following parameters were monitored: oxygen       saturation, heart rate, blood pressure, respiratory rate, EKG, adequacy       of pulmonary ventilation, and response to care. Total physician       intraservice time was 22 minutes. Recommendation:           - Patient has a contact number available for                            emergencies. The signs and symptoms of potential                            delayed complications were discussed with the                            patient. Return to normal activities tomorrow.                            Written discharge instructions were provided to the                            patient.                           - Resume previous diet.                           - Continue present medications.                           -  Repeat colonoscopy date to be determined after                            pending pathology results are reviewed for                            surveillance based on pathology results.                           - Return to GI clinic in 6 weeks. Procedure Code(s):        --- Professional ---                           [phone], Colonoscopy, flexible; with removal of                            tumor(s), polyp(s), or other lesion(s) by snare                            technique Diagnosis Code(s):        --- Professional ---                           D12.5, Benign neoplasm of sigmoid colon                           K52.9, Noninfective gastroenteritis and colitis,                            unspecified                           K57.30, Diverticulosis of large intestine without                            perforation or abscess without bleeding CPT copyright 2016 American Medical Association. All rights reserved. The codes documented in this  report are preliminary and upon coder review may  be revised to meet current compliance requirements. Cristopher Estimable. Sherrel Ploch, MD Norvel Richards, MD 07/02/2016 10:15:43 AM This report has been signed electronically. Number of Addenda: 0

## 2016-07-02 NOTE — Anesthesia Preprocedure Evaluation (Signed)
Anesthesia Evaluation  Patient identified by MRN, date of birth, ID band Patient awake    Reviewed: Allergy & Precautions, NPO status , Patient's Chart, lab work & pertinent test results  History of Anesthesia Complications (+) PONV and history of anesthetic complications  Airway Mallampati: II  TM Distance: >3 FB     Dental  (+) Teeth Intact, Caps,    Pulmonary asthma (inactive) , former smoker,    breath sounds clear to auscultation       Cardiovascular hypertension, Pt. on medications and Pt. on home beta blockers +CHF   Rhythm:Regular Rate:Normal     Neuro/Psych Anxiety    GI/Hepatic negative GI ROS,   Endo/Other  Hypothyroidism   Renal/GU      Musculoskeletal   Abdominal   Peds  Hematology   Anesthesia Other Findings   Reproductive/Obstetrics                             Anesthesia Physical Anesthesia Plan  ASA: III  Anesthesia Plan: MAC   Post-op Pain Management:    Induction: Intravenous  Airway Management Planned: Simple Face Mask  Additional Equipment:   Intra-op Plan:   Post-operative Plan:   Informed Consent: I have reviewed the patients History and Physical, chart, labs and discussed the procedure including the risks, benefits and alternatives for the proposed anesthesia with the patient or authorized representative who has indicated his/her understanding and acceptance.     Plan Discussed with:   Anesthesia Plan Comments:         Anesthesia Quick Evaluation

## 2016-07-02 NOTE — Interval H&P Note (Signed)
History and Physical Interval Note:  07/02/2016 9:31 AM  Meredith Wong  has presented today for surgery, with the diagnosis of DIARRHEA  The various methods of treatment have been discussed with the patient and family. After consideration of risks, benefits and other options for treatment, the patient has consented to  Procedure(s) with comments: COLONOSCOPY WITH PROPOFOL (N/A) - 1015 as a surgical intervention .  The patient's history has been reviewed, patient examined, no change in status, stable for surgery.  I have reviewed the patient's chart and labs.  Questions were answered to the patient's satisfaction.     No change. Diagnostic colonoscopy per plan. Stool studies negative.  The risks, benefits, limitations, alternatives and imponderables have been reviewed with the patient. Questions have been answered. All parties are agreeable.     Manus Rudd

## 2016-07-02 NOTE — Transfer of Care (Signed)
Immediate Anesthesia Transfer of Care Note  Patient: Meredith Wong  Procedure(s) Performed: Procedure(s) with comments: COLONOSCOPY WITH PROPOFOL (N/A) - 1015 BIOPSY - colon POLYPECTOMY - colon  Patient Location: PACU  Anesthesia Type:MAC  Level of Consciousness: awake, alert  and oriented  Airway & Oxygen Therapy: Patient Spontanous Breathing and Patient connected to nasal cannula oxygen  Post-op Assessment: Report given to RN and Post -op Vital signs reviewed and stable  Post vital signs: Reviewed and stable  Last Vitals:  Vitals:   07/02/16 0915 07/02/16 0930  BP: (!) 163/85 (!) 180/77  Resp: 14 (!) 34  Temp:      Last Pain:  Vitals:   07/02/16 0914  TempSrc: Oral      Patients Stated Pain Goal: 7 (03/17/10 7356)  Complications: No apparent anesthesia complications

## 2016-07-02 NOTE — H&P (View-Only) (Signed)
Cdiff negative. Awaiting stool culture and Giardia (which likely will be negative). Colonoscopy as planned.

## 2016-07-02 NOTE — Discharge Instructions (Addendum)
Colonoscopy Discharge Instructions  Read the instructions outlined below and refer to this sheet in the next few weeks. These discharge instructions provide you with general information on caring for yourself after you leave the hospital. Your doctor may also give you specific instructions. While your treatment has been planned according to the most current medical practices available, unavoidable complications occasionally occur. If you have any problems or questions after discharge, call Dr. Gala Romney at [phone]. ACTIVITY  You may resume your regular activity, but move at a slower pace for the next 24 hours.   Take frequent rest periods for the next 24 hours.   Walking will help get rid of the air and reduce the bloated feeling in your belly (abdomen).   No driving for 24 hours (because of the medicine (anesthesia) used during the test).    Do not sign any important legal documents or operate any machinery for 24 hours (because of the anesthesia used during the test).  NUTRITION  Drink plenty of fluids.   You may resume your normal diet as instructed by your doctor.   Begin with a light meal and progress to your normal diet. Heavy or fried foods are harder to digest and may make you feel sick to your stomach (nauseated).   Avoid alcoholic beverages for 24 hours or as instructed.  MEDICATIONS  You may resume your normal medications unless your doctor tells you otherwise.  WHAT YOU CAN EXPECT TODAY  Some feelings of bloating in the abdomen.   Passage of more gas than usual.   Spotting of blood in your stool or on the toilet paper.  IF YOU HAD POLYPS REMOVED DURING THE COLONOSCOPY:  No aspirin products for 7 days or as instructed.   No alcohol for 7 days or as instructed.   Eat a soft diet for the next 24 hours.  FINDING OUT THE RESULTS OF YOUR TEST Not all test results are available during your visit. If your test results are not back during the visit, make an appointment  with your caregiver to find out the results. Do not assume everything is normal if you have not heard from your caregiver or the medical facility. It is important for you to follow up on all of your test results.  SEEK IMMEDIATE MEDICAL ATTENTION IF:  You have more than a spotting of blood in your stool.   Your belly is swollen (abdominal distention).   You are nauseated or vomiting.   You have a temperature over 101.   You have abdominal pain or discomfort that is severe or gets worse throughout the day.     Colon polyp and diverticulosis information provided  Further recommendations to follow pending review of pathology report   Colonoscopy, Adult, Care After This sheet gives you information about how to care for yourself after your procedure. Your health care provider may also give you more specific instructions. If you have problems or questions, contact your health care provider. What can I expect after the procedure? After the procedure, it is common to have:  A small amount of blood in your stool for 24 hours after the procedure.  Some gas.  Mild abdominal cramping or bloating. Follow these instructions at home: General instructions  For the first 24 hours after the procedure:  Do not drive or use machinery.  Do not sign important documents.  Do not drink alcohol.  Do your regular daily activities at a slower pace than normal.  Eat soft, easy-to-digest foods.  Rest often.  Take over-the-counter or prescription medicines only as told by your health care provider.  It is up to you to get the results of your procedure. Ask your health care provider, or the department performing the procedure, when your results will be ready. Relieving cramping and bloating  Try walking around when you have cramps or feel bloated.  Apply heat to your abdomen as told by your health care provider. Use a heat source that your health care provider recommends, such as a moist heat  pack or a heating pad.  Place a towel between your skin and the heat source.  Leave the heat on for 20-30 minutes.  Remove the heat if your skin turns bright red. This is especially important if you are unable to feel pain, heat, or cold. You may have a greater risk of getting burned. Eating and drinking  Drink enough fluid to keep your urine clear or pale yellow.  Resume your normal diet as instructed by your health care provider. Avoid heavy or fried foods that are hard to digest.  Avoid drinking alcohol for as long as instructed by your health care provider. Contact a health care provider if:  You have blood in your stool 2-3 days after the procedure. Get help right away if:  You have more than a small spotting of blood in your stool.  You pass large blood clots in your stool.  Your abdomen is swollen.  You have nausea or vomiting.  You have a fever.  You have increasing abdominal pain that is not relieved with medicine. This information is not intended to replace advice given to you by your health care provider. Make sure you discuss any questions you have with your health care provider. Document Released: 12/27/2003 Document Revised: 02/06/2016 Document Reviewed: 07/26/2015 Elsevier Interactive Patient Education  2017 [employer].   Colon Polyps Introduction Polyps are tissue growths inside the body. Polyps can grow in many places, including the large intestine (colon). A polyp may be a round bump or a mushroom-shaped growth. You could have one polyp or several. Most colon polyps are noncancerous (benign). However, some colon polyps can become cancerous over time. What are the causes? The exact cause of colon polyps is not known. What increases the risk? This condition is more likely to develop in people who:  Have a family history of colon cancer or colon polyps.  Are older than 60 or older than 45 if they are African American.  Have inflammatory bowel disease,  such as ulcerative colitis or Crohn disease.  Are overweight.  Smoke cigarettes.  Do not get enough exercise.  Drink too much alcohol.  Eat a diet that is:  High in fat and red meat.  Low in fiber.  Had childhood cancer that was treated with abdominal radiation. What are the signs or symptoms? Most polyps do not cause symptoms. If you have symptoms, they may include:  Blood coming from your rectum when having a bowel movement.  Blood in your stool.The stool may look dark red or black.  A change in bowel habits, such as constipation or diarrhea. How is this diagnosed? This condition is diagnosed with a colonoscopy. This is a procedure that uses a lighted, flexible scope to look at the inside of your colon. How is this treated? Treatment for this condition involves removing any polyps that are found. Those polyps will then be tested for cancer. If cancer is found, your health care provider will talk to you about  options for colon cancer treatment. Follow these instructions at home: Diet  Eat plenty of fiber, such as fruits, vegetables, and whole grains.  Eat foods that are high in calcium and vitamin D, such as milk, cheese, yogurt, eggs, liver, fish, and broccoli.  Limit foods high in fat, red meats, and processed meats, such as hot dogs, sausage, bacon, and lunch meats.  Maintain a healthy weight, or lose weight if recommended by your health care provider. General instructions  Do not smoke cigarettes.  Do not drink alcohol excessively.  Keep all follow-up visits as told by your health care provider. This is important. This includes keeping regularly scheduled colonoscopies. Talk to your health care provider about when you need a colonoscopy.  Exercise every day or as told by your health care provider. Contact a health care provider if:  You have new or worsening bleeding during a bowel movement.  You have new or increased blood in your stool.  You have a change  in bowel habits.  You unexpectedly lose weight. This information is not intended to replace advice given to you by your health care provider. Make sure you discuss any questions you have with your health care provider. Document Released: 02/08/2004 Document Revised: 10/20/2015 Document Reviewed: 04/04/2015  2017 Elsevier

## 2016-07-03 NOTE — Anesthesia Postprocedure Evaluation (Signed)
Anesthesia Post Note  Patient: Meredith Wong  Procedure(s) Performed: Procedure(s) (LRB): COLONOSCOPY WITH PROPOFOL (N/A) BIOPSY POLYPECTOMY  Patient location during evaluation: PACU Anesthesia Type: MAC Level of consciousness: awake Pain management: satisfactory to patient Vital Signs Assessment: post-procedure vital signs reviewed and stable Respiratory status: spontaneous breathing Cardiovascular status: stable Anesthetic complications: no Comments: Late entry [place] CRNa     Last Vitals:  Vitals:   07/02/16 1030 07/02/16 1039  BP: 136/84 (!) 180/81  Pulse: 63 65  Resp: 12 16  Temp:  36.4 C    Last Pain:  Vitals:   07/02/16 1039  TempSrc: Oral                 Feldt,Chioke Noxon

## 2016-07-05 ENCOUNTER — Encounter: Payer: Self-pay | Admitting: Internal Medicine

## 2016-07-09 ENCOUNTER — Telehealth: Payer: Self-pay

## 2016-07-09 ENCOUNTER — Encounter: Payer: Self-pay | Admitting: Internal Medicine

## 2016-07-09 NOTE — Telephone Encounter (Signed)
Pt called office to get results of colonoscopy. Results given per letter.

## 2016-07-13 ENCOUNTER — Encounter (HOSPITAL_COMMUNITY): Payer: Self-pay | Admitting: Internal Medicine

## 2016-07-25 ENCOUNTER — Encounter: Payer: Self-pay | Admitting: Gastroenterology

## 2016-08-16 ENCOUNTER — Ambulatory Visit (INDEPENDENT_AMBULATORY_CARE_PROVIDER_SITE_OTHER): Payer: BLUE CROSS/BLUE SHIELD | Admitting: Gastroenterology

## 2016-08-16 ENCOUNTER — Encounter: Payer: Self-pay | Admitting: Gastroenterology

## 2016-08-16 VITALS — BP 142/83 | HR 74 | Temp 98.4°F | Ht 63.0 in | Wt 225.0 lb

## 2016-08-16 DIAGNOSIS — R7989 Other specified abnormal findings of blood chemistry: Secondary | ICD-10-CM | POA: Diagnosis not present

## 2016-08-16 DIAGNOSIS — R634 Abnormal weight loss: Secondary | ICD-10-CM | POA: Diagnosis not present

## 2016-08-16 DIAGNOSIS — K529 Noninfective gastroenteritis and colitis, unspecified: Secondary | ICD-10-CM

## 2016-08-16 DIAGNOSIS — R945 Abnormal results of liver function studies: Secondary | ICD-10-CM

## 2016-08-16 NOTE — Progress Notes (Signed)
Primary Care Physician: Octavio Graves, DO  Primary Gastroenterologist:  Garfield Cornea, MD   Chief Complaint  Patient presents with  . Diarrhea  . Constipation    HPI: Meredith Wong is a 56 y.o. female here for follow-up of chronic diarrhea, abdominal pain. Ileocolonoscopy last month showed scattered small and large mouth diverticula in sigmoid and descending colon, segmental biopsies of right and left: Benign, 4 mm sigmoid colon polyp benign but given family history she will require follow-up colonoscopy in 5 years. She is also had negative stool studies. Labs previously with mildly abnormal ALT. Also glucose in the 140 range.  Patient has been on chronic narcotics for years. Takes 3 hydrocodone daily. States she has BMs at least 6 times daily. Associated with urgency, occasional incontinence. She has had nocturnal diarrhea, episode last night and waking up in stool. When she goes to have a bowel movement she can have anywhere from hard stools liquidy stool. Denies melena or bright red blood per rectum. Doesn't matter what she eats. Imodium helps slows down a little. Symptoms have not improved with Levsin or Bentyl. Symptoms occurring now for 6 months.  Denies UGI symptoms. Weight down 20 pounds since 11/2015 but stable since 05/2016.    Current Outpatient Prescriptions  Medication Sig Dispense Refill  . albuterol (VENTOLIN HFA) 108 (90 Base) MCG/ACT inhaler Inhale 2 puffs into the lungs every 4 (four) hours as needed for wheezing or shortness of breath.     . ALPRAZolam (XANAX) 0.5 MG tablet Take 0.5 mg by mouth 2 (two) times daily as needed for anxiety.    . budesonide-formoterol (SYMBICORT) 160-4.5 MCG/ACT inhaler Inhale 2 puffs into the lungs 2 (two) times daily as needed (for respiratory issues.).     Marland Kitchen buPROPion (WELLBUTRIN SR) 150 MG 12 hr tablet Take 150 mg by mouth 2 (two) times daily.     . carvedilol (COREG) 25 MG tablet Take 25 mg by mouth 2 (two) times daily.     Marland Kitchen  dicyclomine (BENTYL) 10 MG capsule Take 1 capsule (10 mg total) by mouth 4 (four) times daily -  before meals and at bedtime. 120 capsule 3  . doxylamine, Sleep, (SLEEP AID) 25 MG tablet Take 50 mg by mouth at bedtime.    . furosemide (LASIX) 40 MG tablet Take 40 mg by mouth daily.    Marland Kitchen HYDROcodone-acetaminophen (NORCO/VICODIN) 5-325 MG tablet Take 1 tablet by mouth every 6 (six) hours as needed (for pain.).     Marland Kitchen levothyroxine (SYNTHROID, LEVOTHROID) 125 MCG tablet Take 125 mcg by mouth daily.     Marland Kitchen loratadine (CLARITIN) 10 MG tablet Take 10 mg by mouth daily.     Marland Kitchen losartan (COZAAR) 100 MG tablet Take 100 mg by mouth daily.    . meloxicam (MOBIC) 7.5 MG tablet Take 7.5 mg by mouth daily as needed for pain.     . potassium chloride (K-DUR) 10 MEQ tablet Take 10 mEq by mouth daily.     . verapamil (CALAN-SR) 240 MG CR tablet Take 240 mg by mouth daily.     No current facility-administered medications for this visit.     Allergies as of 08/16/2016 - Review Complete 08/16/2016  Allergen Reaction Noted  . Latex Hives 11/19/2013  . Lisinopril Other (See Comments) 03/10/2015    ROS:  General: Negative for anorexia,  fever, chills, fatigue, weakness.see hpi ENT: Negative for hoarseness, difficulty swallowing , nasal congestion. CV: Negative for chest pain, angina, palpitations, dyspnea  on exertion, peripheral edema.  Respiratory: Negative for dyspnea at rest, dyspnea on exertion, cough, sputum, wheezing.  GI: See history of present illness. GU:  Negative for dysuria, hematuria, urinary incontinence, urinary frequency, nocturnal urination.  Endo: see hpi   Physical Examination:   BP (!) 142/83   Pulse 74   Temp 98.4 F (36.9 C) (Oral)   Ht 5\' 3"  (1.6 m)   Wt 225 lb (102.1 kg)   BMI 39.86 kg/m   General: Well-nourished, well-developed in no acute distress.  Eyes: No icterus. Mouth: Oropharyngeal mucosa moist and pink , no lesions erythema or exudate. Lungs: Clear to auscultation  bilaterally.  Heart: Regular rate and rhythm, no murmurs rubs or gallops.  Abdomen: Bowel sounds are normal, nontender, nondistended, no hepatosplenomegaly or masses, no abdominal bruits or hernia , no rebound or guarding.   Extremities: No lower extremity edema. No clubbing or deformities. Neuro: Alert and oriented x 4   Skin: Warm and dry, no jaundice.   Psych: Alert and cooperative, normal mood and affect.

## 2016-08-16 NOTE — Assessment & Plan Note (Signed)
8 month h/o diarrhea associated with abdominal cramping, urgency, unintentional weight loss. Recent ileocolonoscopy unremarkable. Stool studies negative. She has failed bentyl and levsin. Notes increase family stress with death of mother in 2016/07/08. At this point, would recommend further labs. Rule out celiac disease. Will follow up on elevated ALT and elevated glucose. Based on results, may consider colestid.

## 2016-08-16 NOTE — Patient Instructions (Signed)
1. Please have your labs done today. We will contact you within 5 business days with results.

## 2016-08-17 LAB — COMPREHENSIVE METABOLIC PANEL
ALK PHOS: 44 U/L (ref 33–130)
ALT: 13 U/L (ref 6–29)
AST: 14 U/L (ref 10–35)
Albumin: 3.8 g/dL (ref 3.6–5.1)
BUN: 13 mg/dL (ref 7–25)
CO2: 29 mmol/L (ref 20–31)
CREATININE: 0.66 mg/dL (ref 0.50–1.05)
Calcium: 8.9 mg/dL (ref 8.6–10.4)
Chloride: 107 mmol/L (ref 98–110)
Glucose, Bld: 87 mg/dL (ref 65–99)
POTASSIUM: 4.5 mmol/L (ref 3.5–5.3)
SODIUM: 143 mmol/L (ref 135–146)
TOTAL PROTEIN: 6.3 g/dL (ref 6.1–8.1)
Total Bilirubin: 0.3 mg/dL (ref 0.2–1.2)

## 2016-08-17 LAB — TISSUE TRANSGLUTAMINASE, IGA: Tissue Transglutaminase Ab, IgA: 1 U/mL (ref <4)

## 2016-08-17 LAB — HEMOGLOBIN A1C
HEMOGLOBIN A1C: 5.5 % (ref <5.7)
Mean Plasma Glucose: 111 mg/dL

## 2016-08-17 LAB — LIPASE: LIPASE: 12 U/L (ref 7–60)

## 2016-08-17 LAB — IGA: IgA: 263 mg/dL (ref 81–463)

## 2016-08-17 LAB — HEPATITIS C ANTIBODY: HCV Ab: NEGATIVE

## 2016-08-17 LAB — HEPATITIS B SURFACE ANTIGEN: HEP B S AG: NEGATIVE

## 2016-08-17 NOTE — Progress Notes (Signed)
cc'ed to pcp °

## 2016-08-20 ENCOUNTER — Ambulatory Visit: Payer: BLUE CROSS/BLUE SHIELD | Admitting: Gastroenterology

## 2016-08-30 NOTE — Progress Notes (Signed)
Labs look good. She has negative celiac screen. Her lfts are now normal. Hep b and c screen negative. Lipase normal.   Please let patient know that would recommend CTA abd/pelvis for chronic diarrhea/weight loss/llq pain/rule out mesenteric ischemia

## 2016-08-31 ENCOUNTER — Other Ambulatory Visit: Payer: Self-pay

## 2016-08-31 DIAGNOSIS — K529 Noninfective gastroenteritis and colitis, unspecified: Secondary | ICD-10-CM

## 2016-08-31 DIAGNOSIS — R634 Abnormal weight loss: Secondary | ICD-10-CM

## 2016-08-31 DIAGNOSIS — R1032 Left lower quadrant pain: Secondary | ICD-10-CM

## 2016-09-17 ENCOUNTER — Ambulatory Visit (HOSPITAL_COMMUNITY): Payer: BLUE CROSS/BLUE SHIELD

## 2016-10-17 NOTE — Progress Notes (Signed)
Patient did not have her CTA, per epic patient cancelled. Can we send a do not neglect health letter.

## 2017-01-02 ENCOUNTER — Encounter (INDEPENDENT_AMBULATORY_CARE_PROVIDER_SITE_OTHER): Payer: Self-pay | Admitting: Orthopaedic Surgery

## 2017-01-02 ENCOUNTER — Ambulatory Visit (INDEPENDENT_AMBULATORY_CARE_PROVIDER_SITE_OTHER): Payer: BLUE CROSS/BLUE SHIELD | Admitting: Orthopaedic Surgery

## 2017-01-02 ENCOUNTER — Ambulatory Visit (INDEPENDENT_AMBULATORY_CARE_PROVIDER_SITE_OTHER): Payer: Self-pay

## 2017-01-02 VITALS — Ht 63.0 in | Wt 227.0 lb

## 2017-01-02 DIAGNOSIS — M19012 Primary osteoarthritis, left shoulder: Secondary | ICD-10-CM | POA: Diagnosis not present

## 2017-01-02 DIAGNOSIS — M25512 Pain in left shoulder: Secondary | ICD-10-CM

## 2017-01-02 NOTE — Progress Notes (Signed)
Office Visit Note   Patient: Meredith Wong           Date of Birth: 06-12-1960           MRN: [id number] Visit Date: 01/02/2017              Requested by: Octavio Graves, DO [address] [place], [place] [address] PCP: Octavio Graves, DO   Assessment & Plan: Visit Diagnoses: Primary osteoarthritis left shoulder   Plan: Long discussion regarding diagnosis and treatment options. We talked about intra-articular cortisone injections, physical therapy and even definitive treatment of shoulder replacement. I certainly would reserve the latter at a point where she is severely compromised. The moment she'll just continue with her anti-inflammatory medicines and exercises. I think she has a good understanding of the diagnosis, treatment options and potential course over time  Follow-Up Instructions: No Follow-up on file.   Orders:  No orders of the defined types were placed in this encounter.  No orders of the defined types were placed in this encounter.     Procedures: No procedures performed   Clinical Data: No additional findings.   Subjective: Chief Complaint  Patient presents with  . Left Shoulder - Pain    Meredith Wong is a 56 y o that presents with Left shoulder pain x 1 month. Denies injury, hurts at night, oxycodone for pain by PCP. Pain radiating to elbow, burning "feels like it slips out".  Noted insidious onset of left shoulder pain approximately a month ago. No obvious injury or trauma. However, she does do a lot of  Activities at home and at work involving overhead activity. Her pain has reached a point now where she is quite uncomfortable with sleeping and with any overhead activity. She denies any numbness or tingling. No problems with cervical spine. On occasion she'll feel a "clunk" in her left shoulder with considerable pain. Presently she is asymptomatic except with pain on certain motions. She does have difficulty reaching overhead and dressing. HPI  Review  of Systems  Constitutional: Negative for chills, fatigue and fever.  Eyes: Negative for itching.  Respiratory: Positive for chest tightness and shortness of breath.   Cardiovascular: Negative for chest pain, palpitations and leg swelling.  Gastrointestinal: Negative for blood in stool, constipation and diarrhea.  Musculoskeletal: Positive for joint swelling. Negative for back pain, neck pain and neck stiffness.  Neurological: Positive for dizziness. Negative for numbness.  Hematological: Does not bruise/bleed easily.  Psychiatric/Behavioral: The patient is nervous/anxious.      Objective: Vital Signs: Ht 5\' 3"  (1.6 m)   Wt 227 lb (103 kg)   BMI 40.21 kg/m   Physical Exam  Ortho Exam positive impingement left shoulder. Obvious "crunch" with attempted overhead motion above 90 localized to the glenohumeral joint. Biceps intact. Good grip and release. Skin intact. No pain to range of motion of cervical spine. Neurovascular exam intact distally. No swelling. Able to slowly raise her arm overhead with only mild loss of motion and flexion. Unable to touch the middle of her back with her left hand. No pain at the acromioclavicular joint.  Specialty Comments:  No specialty comments available.  Imaging: No results found.   PMFS History: Patient Active Problem List   Diagnosis Date Noted  . Abnormal weight loss 08/16/2016  . Abnormal LFTs 08/16/2016  . FH: colon cancer 06/11/2016  . Chronic diarrhea 06/11/2016   Past Medical History:  Diagnosis Date  . Anxiety   . Arthritis   .  Asthma   . CHF (congestive heart failure) ([place])   . Essential hypertension   . History of cardiac catheterization    Normal coronaries July 2015 [facility])  . History of kidney stones   . Hypothyroidism   . Nonischemic cardiomyopathy ([place])   . PONV (postoperative nausea and vomiting)   . Thyroid cancer ([place])    skin cancer and cancer of thyroid    Family History  Problem Relation Age of Onset  .  Thyroid cancer Father   . Heart disease Father   . Colon cancer Father 25       metastatic   . Thyroid cancer Mother   . Thyroid cancer Sister   . Crohn's disease Sister   . Breast cancer Maternal Grandmother   . Cervical cancer Maternal Grandmother   . Thyroid cancer Paternal Grandmother   . Colon cancer Paternal Uncle   . Liver disease Neg Hx     Past Surgical History:  Procedure Laterality Date  . BIOPSY  07/02/2016   Procedure: BIOPSY;  Surgeon: Daneil Dolin, MD;  Location[facility];  Service: Endoscopy;;  colon  . CARPAL TUNNEL RELEASE Left   . CHOLECYSTECTOMY     1990s  . COLONOSCOPY WITH PROPOFOL N/A 07/02/2016   Procedure: COLONOSCOPY WITH PROPOFOL;  Surgeon: Daneil Dolin, MD;  Location: [facility];  Service: Endoscopy;  Laterality: N/A;  1015  . POLYPECTOMY  07/02/2016   Procedure: POLYPECTOMY;  Surgeon: Daneil Dolin, MD;  Location: [facility];  Service: Endoscopy;;  colon  . REPLACEMENT TOTAL KNEE Right   . THYROIDECTOMY  1999   Social History   Occupational History  . [employer] in [place] History Main Topics  . Smoking status: Former Smoker    Packs/day: 0.25    Years: 2.00    Types: Cigarettes    Quit date: 06/26/1975  . Smokeless tobacco: Never Used     Comment: smoked as a teenager   . Alcohol use No  . Drug use: No  . Sexual activity: Not Currently    Birth control/ protection: Post-menopausal

## 2018-01-28 ENCOUNTER — Telehealth: Payer: Self-pay | Admitting: Cardiology

## 2018-01-28 NOTE — Telephone Encounter (Signed)
Numerous attempts to contact patient with recall letters. Unable to reach by telephone. with no success.    07/12/2016 8:33 AM New [10]    Meredith Wong [[id number]] 07/12/2016 8:34 AM Notification Sent [20]   Meredith Wong [[id number]] 07/26/2016 3:27 PM Notification Sent [20]   Meredith Wong [[id number]] 09/21/2016 9:05 AM Notification Sent [20]   Meredith Wong [[id number]] 12/19/2016 10:39 AM Notification Sent [20]   Meredith Wong [[id number]] 07/30/2017 11:56 AM Notification Sent [20]

## 2021-03-30 ENCOUNTER — Other Ambulatory Visit (HOSPITAL_COMMUNITY): Payer: Self-pay | Admitting: Internal Medicine

## 2021-03-30 DIAGNOSIS — M79661 Pain in right lower leg: Secondary | ICD-10-CM

## 2021-04-04 ENCOUNTER — Ambulatory Visit (HOSPITAL_COMMUNITY)
Admission: RE | Admit: 2021-04-04 | Discharge: 2021-04-04 | Disposition: A | Discharge status: Home / Self Care | Payer: BC Managed Care – PPO | Source: Ambulatory Visit | Attending: Internal Medicine | Admitting: Internal Medicine

## 2021-04-04 ENCOUNTER — Other Ambulatory Visit: Payer: Self-pay

## 2021-04-04 DIAGNOSIS — M7989 Other specified soft tissue disorders: Secondary | ICD-10-CM | POA: Diagnosis present

## 2021-04-04 DIAGNOSIS — M79661 Pain in right lower leg: Secondary | ICD-10-CM | POA: Diagnosis present

## 2021-07-12 ENCOUNTER — Encounter: Payer: Self-pay | Admitting: *Deleted
